# Patient Record
Sex: Male | Born: 1970 | Race: White | Hispanic: No | Marital: Married | Smoking: Former smoker
Health system: Southern US, Community
[De-identification: ages and names within clinical notes are randomized; demographics above are authoritative.]

---

## 2015-01-31 ENCOUNTER — Other Ambulatory Visit: Payer: Self-pay | Admitting: Orthopaedic Surgery

## 2015-01-31 DIAGNOSIS — M5412 Radiculopathy, cervical region: Secondary | ICD-10-CM

## 2015-02-09 ENCOUNTER — Ambulatory Visit
Admission: RE | Admit: 2015-02-09 | Discharge: 2015-02-09 | Disposition: A | Discharge status: Home / Self Care | Payer: BLUE CROSS/BLUE SHIELD | Source: Ambulatory Visit | Attending: Orthopaedic Surgery | Admitting: Orthopaedic Surgery

## 2015-02-09 ENCOUNTER — Other Ambulatory Visit: Payer: Self-pay | Admitting: Orthopaedic Surgery

## 2015-02-09 ENCOUNTER — Inpatient Hospital Stay
Admission: RE | Admit: 2015-02-09 | Discharge: 2015-02-09 | Disposition: A | Discharge status: Home / Self Care | Payer: Self-pay | Source: Ambulatory Visit | Attending: Orthopaedic Surgery | Admitting: Orthopaedic Surgery

## 2015-02-09 DIAGNOSIS — M5412 Radiculopathy, cervical region: Secondary | ICD-10-CM

## 2015-02-09 MED ORDER — ONDANSETRON HCL 4 MG/2ML IJ SOLN
4.0000 mg | Freq: Once | INTRAMUSCULAR | Status: AC
  Administered 2015-02-09: 4 mg via INTRAMUSCULAR

## 2015-02-09 MED ORDER — MEPERIDINE HCL 100 MG/ML IJ SOLN
75.0000 mg | Freq: Once | INTRAMUSCULAR | Status: AC
  Administered 2015-02-09: 75 mg via INTRAMUSCULAR

## 2015-02-09 MED ORDER — DIAZEPAM 5 MG PO TABS
10.0000 mg | ORAL_TABLET | Freq: Once | ORAL | Status: AC
  Administered 2015-02-09: 10 mg via ORAL

## 2015-02-09 MED ORDER — IOHEXOL 300 MG/ML  SOLN
10.0000 mL | Freq: Once | INTRAMUSCULAR | Status: DC | PRN
  Administered 2015-02-09: 10 mL via INTRATHECAL

## 2015-02-09 NOTE — Discharge Instructions (Signed)
Myelogram Discharge Instructions  1. Go home and rest quietly for the next 24 hours.  It is important to lie flat for the next 24 hours.  Get up only to go to the restroom.  You may lie in the bed or on a couch on your back, your stomach, your left side or your right side.  You may have one pillow under your head.  You may have pillows between your knees while you are on your side or under your knees while you are on your back.  2. DO NOT drive today.  Recline the seat as far back as it will go, while still wearing your seat belt, on the way home.  3. You may get up to go to the bathroom as needed.  You may sit up for 10 minutes to eat.  You may resume your normal diet and medications unless otherwise indicated.  Drink lots of extra fluids today and tomorrow.  4. The incidence of headache, nausea, or vomiting is about 5% (one in 20 patients).  If you develop a headache, lie flat and drink plenty of fluids until the headache goes away.  Caffeinated beverages may be helpful.  If you develop severe nausea and vomiting or a headache that does not go away with flat bed rest, call [phone].  5. You may resume normal activities after your 24 hours of bed rest is over; however, do not exert yourself strongly or do any heavy lifting tomorrow. If when you get up you have a headache when standing, go back to bed and force fluids for another 24 hours.  6. Call your physician for a follow-up appointment.  The results of your myelogram will be sent directly to your physician by the following day.  7. If you have any questions or if complications develop after you arrive home, please call [phone].  Discharge instructions have been explained to the patient.  The patient, or the person responsible for the patient, fully understands these instructions.      May resume [employer] on Nov. 10, 2016, after 1:00 pm.

## 2015-02-09 NOTE — Progress Notes (Signed)
Pt states he has been off Nucyn[employer] for the past 2 days. Discharge instructions explained to pt. 

## 2015-12-30 ENCOUNTER — Other Ambulatory Visit: Payer: Self-pay | Admitting: Orthopaedic Surgery

## 2015-12-30 DIAGNOSIS — M542 Cervicalgia: Secondary | ICD-10-CM

## 2016-01-06 ENCOUNTER — Ambulatory Visit
Admission: RE | Admit: 2016-01-06 | Discharge: 2016-01-06 | Disposition: A | Discharge status: Home / Self Care | Payer: BLUE CROSS/BLUE SHIELD | Source: Ambulatory Visit | Attending: Orthopaedic Surgery | Admitting: Orthopaedic Surgery

## 2016-01-06 DIAGNOSIS — M542 Cervicalgia: Secondary | ICD-10-CM

## 2018-08-20 IMAGING — MR MR CERVICAL SPINE W/O CM
4 of 6 series · 23 of 48 positions shown · non-contrast
Comparison: 02/09/2015

CLINICAL DATA: Pain in weakness in the neck, shoulder, and arms.
Numbness in the back of the neck and in both hands.

EXAM:
MRI CERVICAL SPINE WITHOUT CONTRAST
TECHNIQUE: Multiplanar, multisequence MR imaging of the cervical spine was
performed. No intravenous contrast was administered.

[Series 2: T2 · sagittal · 3.0mm · 0.41mm/px · 5 of 13 slices shown (1 of 4)]
[im 1/13]
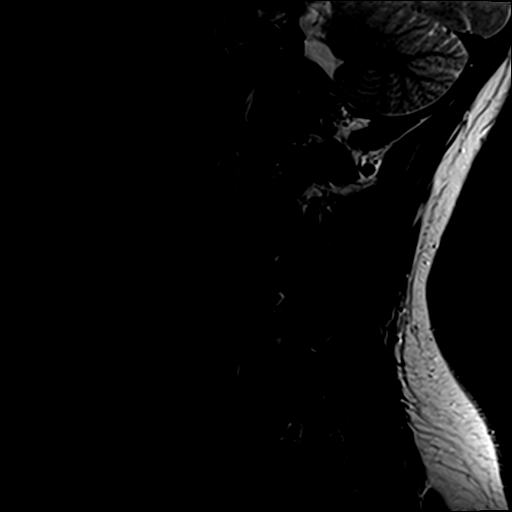
[im 4/13]
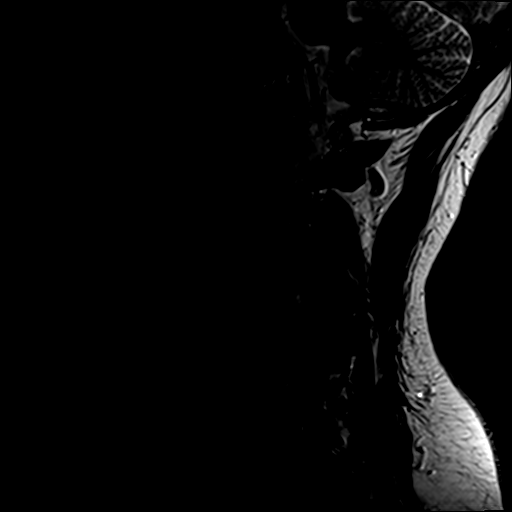
[im 7/13]
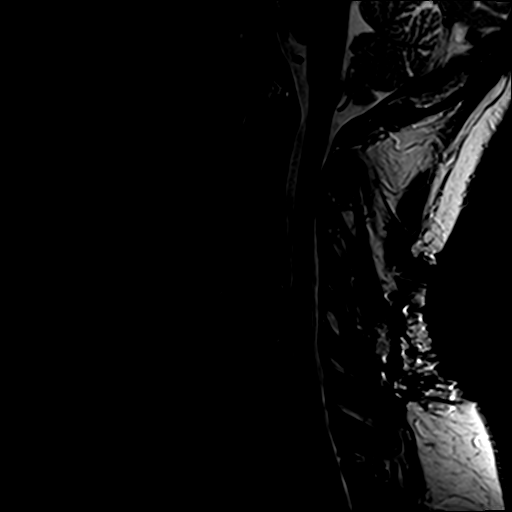
[im 10/13]
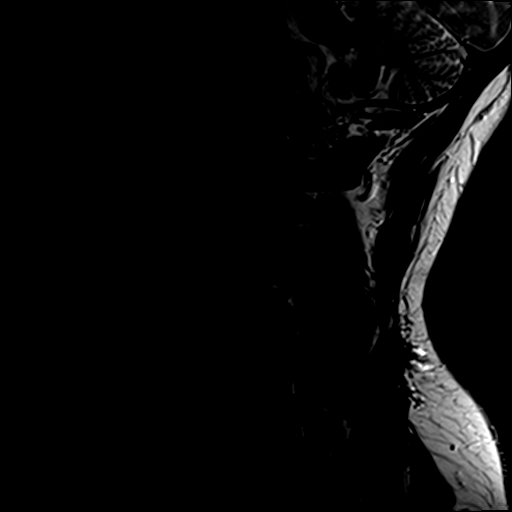
[im 13/13]
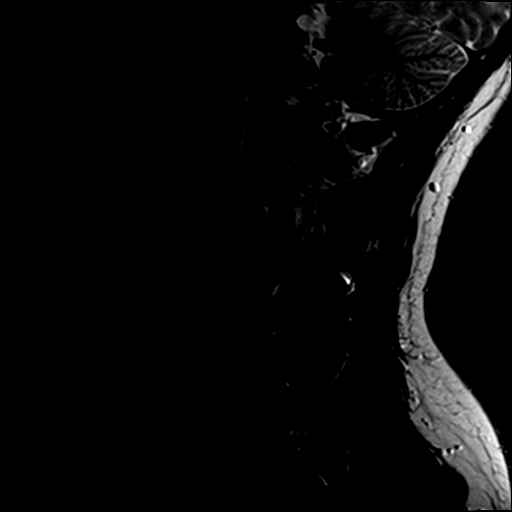

[Series 5: T2 · axial · 3.0mm · 0.39mm/px · z∈[-16,+87]mm · 11 of 28 slices shown (2 of 4)]
[im 1/28]
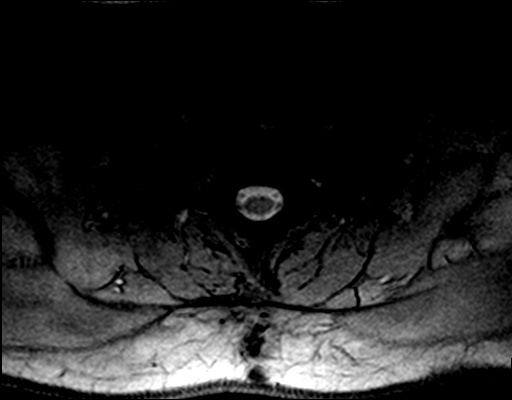
[im 3/28]
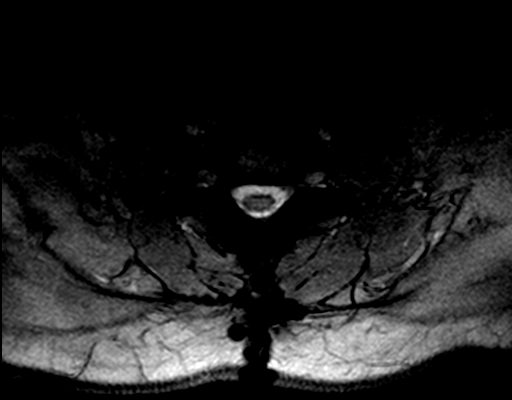
[im 6/28]
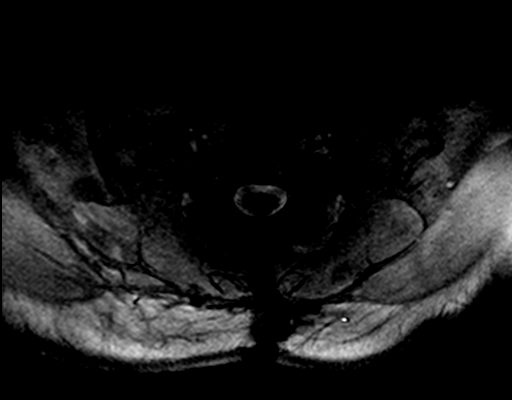
[im 9/28]
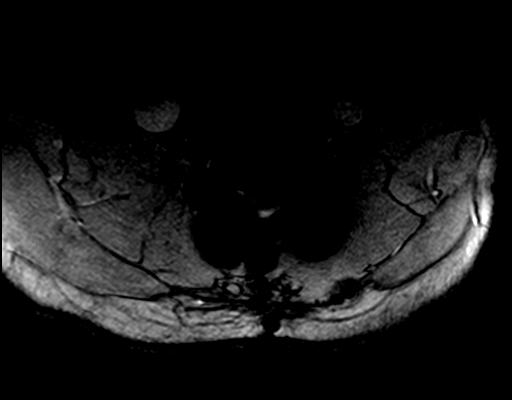
[im 11/28]
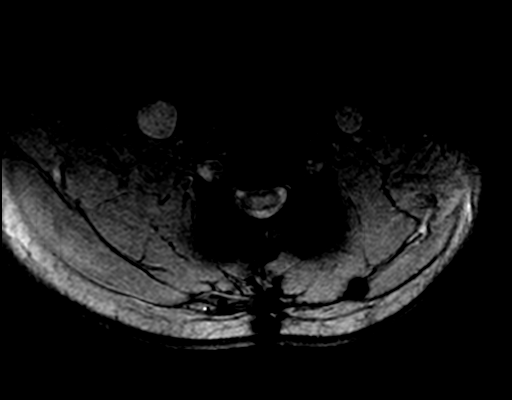
[im 14/28]
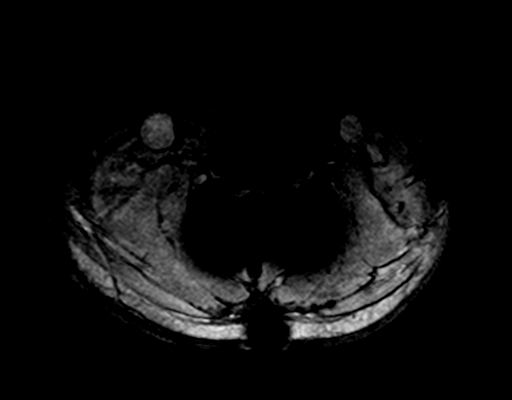
[im 17/28]
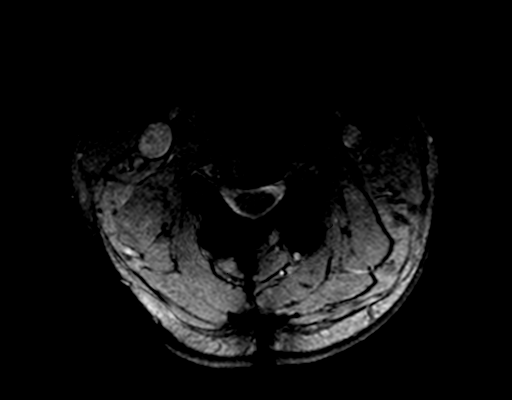
[im 19/28]
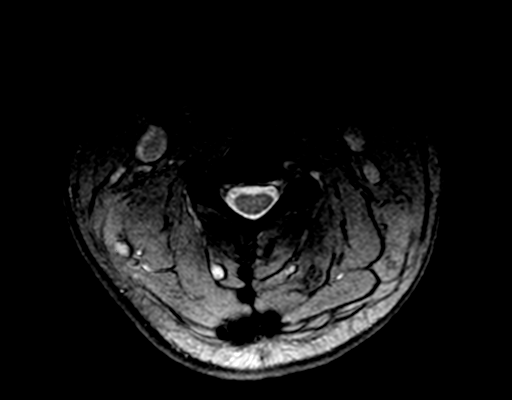
[im 22/28]
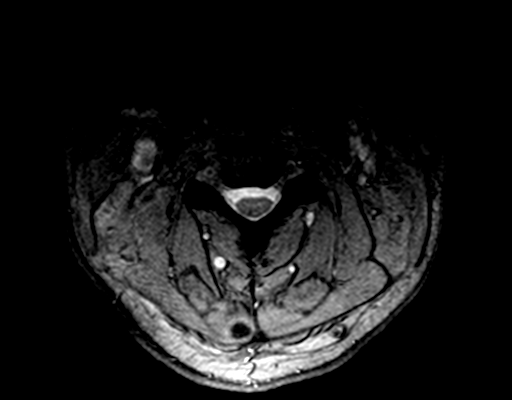
[im 25/28]
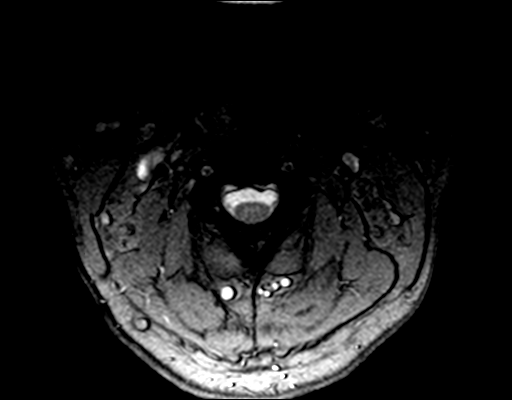
[im 28/28]
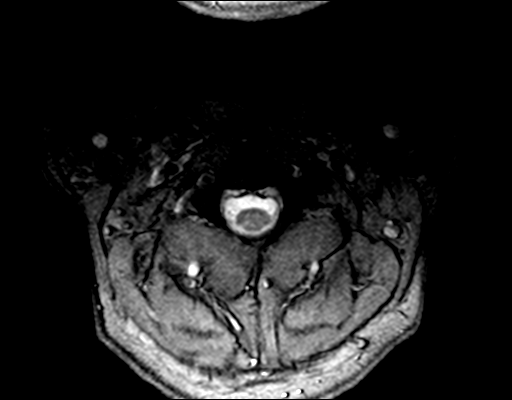

[Series 6: T2 · axial · 3.0mm · 0.39mm/px · z∈[-16,+76]mm · 4 of 28 slices shown (3 of 4)]
[im 1/28]
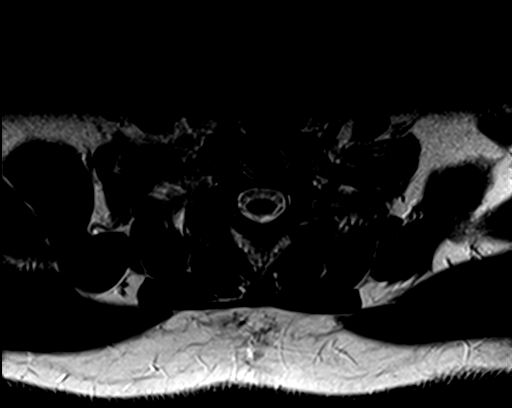
[im 3/28]
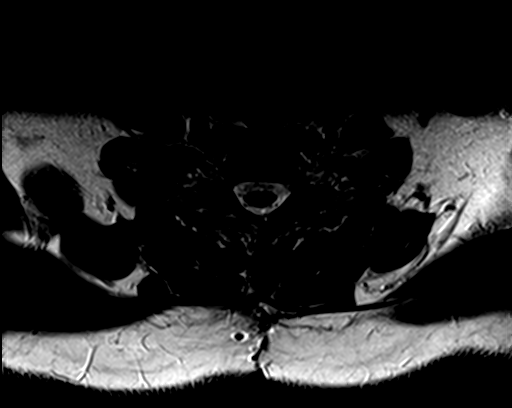
[im 14/28]
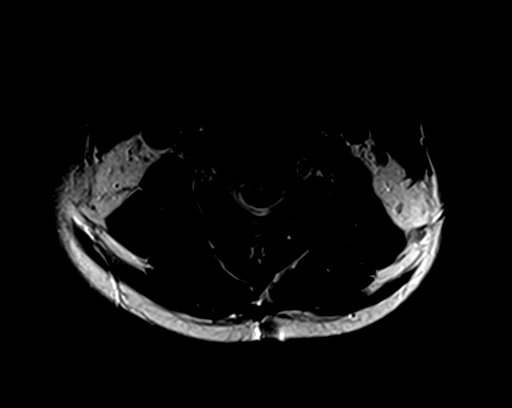
[im 25/28]
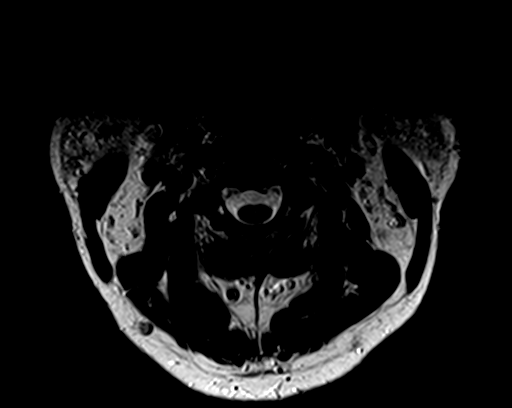

[Series 7: T2 · axial · 3.0mm · 0.39mm/px · z∈[-9,+76]mm · 3 of 28 slices shown (4 of 4)]
[im 3/28]
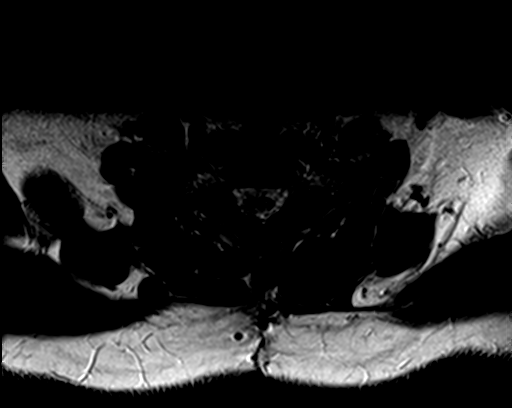
[im 14/28]
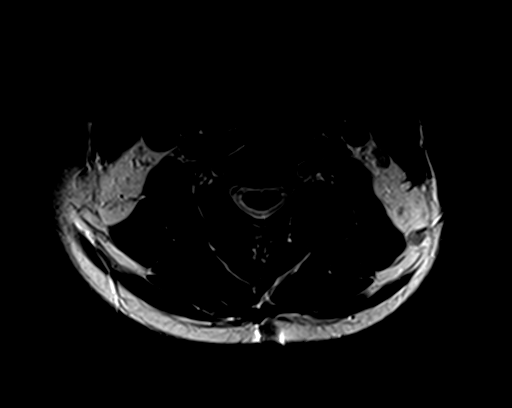
[im 25/28]
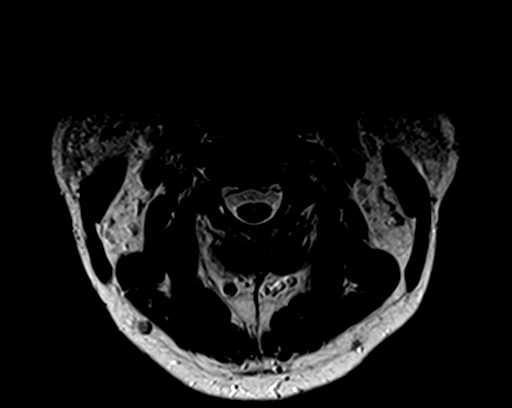

[23 of 48 positions shown; findings below may reference images not displayed]

FINDINGS: Despite efforts by the technologist and patient, motion artifact is
present on today's exam and could not be eliminated. This reduces
exam sensitivity and specificity.

Alignment: No vertebral subluxation is observed.

Vertebrae: Solid interbody fusion at C5-6 with anterior plate and
screw fixators well as posterolateral rod and facet screw fixation.
No significant vertebral marrow edema is identified.

Cord: No significant abnormal spinal cord signal is observed.

Posterior Fossa, vertebral arteries, paraspinal tissues:
Unremarkable

Disc levels:

C2-3: Unremarkable.

C3-4: Borderline left foraminal stenosis due to uncinate and facet
arthropathy.

C4-5:  No impingement.  Mild disc bulge.

C5-6:  No impingement.  Fused level.

C6-7: Mild left and borderline right foraminal stenosis due to
uncinate and facet spurring.

C7-T1:  Unremarkable.
IMPRESSION: 1. Cervical spondylosis and degenerative disc disease, causing mild
left foraminal impingement at C6-7.
2. No impingement at the fused C5-6 level.

## 2019-06-19 ENCOUNTER — Ambulatory Visit: Payer: BC Managed Care – PPO | Attending: Internal Medicine

## 2019-06-19 ENCOUNTER — Other Ambulatory Visit: Payer: Self-pay

## 2019-06-19 DIAGNOSIS — Z23 Encounter for immunization: Secondary | ICD-10-CM

## 2019-06-19 NOTE — Progress Notes (Signed)
   [facility]  Name:  [name]    MRN: [id number] DOB: February 20, 1971  06/19/2019  Mr. Risse was observed post Covid-19 immunization for 15 minutes without incident. He was provided with Vaccine Information Sheet and instruction to access the V-Safe system.   Mr. Biermann was instructed to call [phone] with any severe reactions post vaccine: Marland Kitchen Difficulty breathing  . Swelling of face and throat  . A fast heartbeat  . A bad rash all over body  . Dizziness and weakness   Immunizations Administered    Name Date Dose VIS Date Route   Pfizer COVID-19 Vaccine 06/19/2019  4:01 PM 0.3 mL 03/13/2019 Intramuscular   Manufacturer: [employer], [employer]   Lot: [id number]   NDC: [address]-1

## 2019-07-15 ENCOUNTER — Ambulatory Visit: Payer: BC Managed Care – PPO

## 2019-07-21 ENCOUNTER — Ambulatory Visit: Payer: BC Managed Care – PPO

## 2021-04-07 DIAGNOSIS — G894 Chronic pain syndrome: Secondary | ICD-10-CM | POA: Diagnosis not present

## 2021-04-07 DIAGNOSIS — M961 Postlaminectomy syndrome, not elsewhere classified: Secondary | ICD-10-CM | POA: Diagnosis not present

## 2021-04-07 DIAGNOSIS — M792 Neuralgia and neuritis, unspecified: Secondary | ICD-10-CM | POA: Diagnosis not present

## 2021-04-07 DIAGNOSIS — M542 Cervicalgia: Secondary | ICD-10-CM | POA: Diagnosis not present

## 2021-04-26 DIAGNOSIS — M7541 Impingement syndrome of right shoulder: Secondary | ICD-10-CM | POA: Diagnosis not present

## 2021-04-27 DIAGNOSIS — M542 Cervicalgia: Secondary | ICD-10-CM | POA: Diagnosis not present

## 2021-04-27 DIAGNOSIS — G8929 Other chronic pain: Secondary | ICD-10-CM | POA: Diagnosis not present

## 2021-05-15 DIAGNOSIS — G8929 Other chronic pain: Secondary | ICD-10-CM | POA: Diagnosis not present

## 2021-05-15 DIAGNOSIS — M542 Cervicalgia: Secondary | ICD-10-CM | POA: Diagnosis not present

## 2021-05-15 DIAGNOSIS — M792 Neuralgia and neuritis, unspecified: Secondary | ICD-10-CM | POA: Diagnosis not present

## 2021-05-17 DIAGNOSIS — G8929 Other chronic pain: Secondary | ICD-10-CM | POA: Diagnosis not present

## 2021-05-17 DIAGNOSIS — M542 Cervicalgia: Secondary | ICD-10-CM | POA: Diagnosis not present

## 2021-05-18 DIAGNOSIS — Z8639 Personal history of other endocrine, nutritional and metabolic disease: Secondary | ICD-10-CM | POA: Diagnosis not present

## 2021-05-18 DIAGNOSIS — I2510 Atherosclerotic heart disease of native coronary artery without angina pectoris: Secondary | ICD-10-CM | POA: Diagnosis not present

## 2021-05-18 DIAGNOSIS — Z789 Other specified health status: Secondary | ICD-10-CM | POA: Diagnosis not present

## 2021-05-30 DIAGNOSIS — G8929 Other chronic pain: Secondary | ICD-10-CM | POA: Diagnosis not present

## 2021-05-30 DIAGNOSIS — M7541 Impingement syndrome of right shoulder: Secondary | ICD-10-CM | POA: Diagnosis not present

## 2021-05-30 DIAGNOSIS — M25812 Other specified joint disorders, left shoulder: Secondary | ICD-10-CM | POA: Diagnosis not present

## 2021-05-30 DIAGNOSIS — M542 Cervicalgia: Secondary | ICD-10-CM | POA: Diagnosis not present

## 2021-05-30 DIAGNOSIS — Z981 Arthrodesis status: Secondary | ICD-10-CM | POA: Diagnosis not present

## 2021-06-07 DIAGNOSIS — E291 Testicular hypofunction: Secondary | ICD-10-CM | POA: Diagnosis not present

## 2021-06-07 DIAGNOSIS — R351 Nocturia: Secondary | ICD-10-CM | POA: Diagnosis not present

## 2021-06-07 DIAGNOSIS — I2510 Atherosclerotic heart disease of native coronary artery without angina pectoris: Secondary | ICD-10-CM | POA: Diagnosis not present

## 2021-06-07 DIAGNOSIS — R338 Other retention of urine: Secondary | ICD-10-CM | POA: Diagnosis not present

## 2021-06-07 DIAGNOSIS — Z8639 Personal history of other endocrine, nutritional and metabolic disease: Secondary | ICD-10-CM | POA: Diagnosis not present

## 2021-06-07 DIAGNOSIS — R3912 Poor urinary stream: Secondary | ICD-10-CM | POA: Diagnosis not present

## 2021-06-07 DIAGNOSIS — N401 Enlarged prostate with lower urinary tract symptoms: Secondary | ICD-10-CM | POA: Diagnosis not present

## 2021-06-26 DIAGNOSIS — G894 Chronic pain syndrome: Secondary | ICD-10-CM | POA: Diagnosis not present

## 2021-06-26 DIAGNOSIS — M542 Cervicalgia: Secondary | ICD-10-CM | POA: Diagnosis not present

## 2021-07-12 DIAGNOSIS — Z981 Arthrodesis status: Secondary | ICD-10-CM | POA: Diagnosis not present

## 2021-07-12 DIAGNOSIS — M4722 Other spondylosis with radiculopathy, cervical region: Secondary | ICD-10-CM | POA: Diagnosis not present

## 2021-07-21 DIAGNOSIS — M47812 Spondylosis without myelopathy or radiculopathy, cervical region: Secondary | ICD-10-CM | POA: Diagnosis not present

## 2021-07-21 DIAGNOSIS — M542 Cervicalgia: Secondary | ICD-10-CM | POA: Diagnosis not present

## 2021-07-21 DIAGNOSIS — G894 Chronic pain syndrome: Secondary | ICD-10-CM | POA: Diagnosis not present

## 2021-08-02 DIAGNOSIS — R69 Illness, unspecified: Secondary | ICD-10-CM | POA: Diagnosis not present

## 2021-08-03 DIAGNOSIS — R69 Illness, unspecified: Secondary | ICD-10-CM | POA: Diagnosis not present

## 2021-08-07 DIAGNOSIS — I252 Old myocardial infarction: Secondary | ICD-10-CM | POA: Diagnosis not present

## 2021-08-07 DIAGNOSIS — I2510 Atherosclerotic heart disease of native coronary artery without angina pectoris: Secondary | ICD-10-CM | POA: Diagnosis not present

## 2021-08-21 DIAGNOSIS — R69 Illness, unspecified: Secondary | ICD-10-CM | POA: Diagnosis not present

## 2021-08-29 DIAGNOSIS — M7541 Impingement syndrome of right shoulder: Secondary | ICD-10-CM | POA: Diagnosis not present

## 2021-09-19 DIAGNOSIS — R69 Illness, unspecified: Secondary | ICD-10-CM | POA: Diagnosis not present

## 2021-09-22 DIAGNOSIS — M47812 Spondylosis without myelopathy or radiculopathy, cervical region: Secondary | ICD-10-CM | POA: Diagnosis not present

## 2021-09-22 DIAGNOSIS — M7918 Myalgia, other site: Secondary | ICD-10-CM | POA: Diagnosis not present
